# Patient Record
Sex: Male | Born: 1960 | Race: White | Hispanic: No | Marital: Married | Smoking: Current every day smoker
Health system: Southern US, Community
[De-identification: ages and names within clinical notes are randomized; demographics above are authoritative.]

## PROBLEM LIST (undated history)

## (undated) DIAGNOSIS — I10 Essential (primary) hypertension: Secondary | ICD-10-CM

## (undated) HISTORY — PX: TONSILLECTOMY: SHX5217

## (undated) HISTORY — PX: APPENDECTOMY: SHX54

---

## 2019-09-03 HISTORY — PX: COLONOSCOPY: SHX174

## 2020-01-25 ENCOUNTER — Inpatient Hospital Stay (HOSPITAL_COMMUNITY)
Admission: EM | Admit: 2020-01-25 | Discharge: 2020-01-28 | DRG: 964 | Disposition: A | Discharge status: Home / Self Care | Payer: Managed Care, Other (non HMO) | Attending: General Surgery | Admitting: General Surgery

## 2020-01-25 ENCOUNTER — Other Ambulatory Visit: Payer: Self-pay

## 2020-01-25 ENCOUNTER — Emergency Department (HOSPITAL_COMMUNITY): Payer: Managed Care, Other (non HMO)

## 2020-01-25 ENCOUNTER — Inpatient Hospital Stay (HOSPITAL_COMMUNITY): Payer: Managed Care, Other (non HMO)

## 2020-01-25 DIAGNOSIS — S32019A Unspecified fracture of first lumbar vertebra, initial encounter for closed fracture: Secondary | ICD-10-CM | POA: Diagnosis present

## 2020-01-25 DIAGNOSIS — Y92410 Unspecified street and highway as the place of occurrence of the external cause: Secondary | ICD-10-CM

## 2020-01-25 DIAGNOSIS — F1010 Alcohol abuse, uncomplicated: Secondary | ICD-10-CM | POA: Diagnosis present

## 2020-01-25 DIAGNOSIS — I10 Essential (primary) hypertension: Secondary | ICD-10-CM | POA: Diagnosis present

## 2020-01-25 DIAGNOSIS — S270XXA Traumatic pneumothorax, initial encounter: Secondary | ICD-10-CM | POA: Diagnosis present

## 2020-01-25 DIAGNOSIS — S065X9A Traumatic subdural hemorrhage with loss of consciousness of unspecified duration, initial encounter: Secondary | ICD-10-CM

## 2020-01-25 DIAGNOSIS — S32048A Other fracture of fourth lumbar vertebra, initial encounter for closed fracture: Secondary | ICD-10-CM | POA: Diagnosis present

## 2020-01-25 DIAGNOSIS — S065X0A Traumatic subdural hemorrhage without loss of consciousness, initial encounter: Principal | ICD-10-CM | POA: Diagnosis present

## 2020-01-25 DIAGNOSIS — F17210 Nicotine dependence, cigarettes, uncomplicated: Secondary | ICD-10-CM | POA: Diagnosis present

## 2020-01-25 DIAGNOSIS — S065XAA Traumatic subdural hemorrhage with loss of consciousness status unknown, initial encounter: Secondary | ICD-10-CM

## 2020-01-25 DIAGNOSIS — S32029A Unspecified fracture of second lumbar vertebra, initial encounter for closed fracture: Secondary | ICD-10-CM | POA: Diagnosis present

## 2020-01-25 DIAGNOSIS — R519 Headache, unspecified: Secondary | ICD-10-CM | POA: Diagnosis present

## 2020-01-25 DIAGNOSIS — S22059A Unspecified fracture of T5-T6 vertebra, initial encounter for closed fracture: Secondary | ICD-10-CM | POA: Diagnosis present

## 2020-01-25 DIAGNOSIS — Z20822 Contact with and (suspected) exposure to covid-19: Secondary | ICD-10-CM | POA: Diagnosis present

## 2020-01-25 DIAGNOSIS — J939 Pneumothorax, unspecified: Secondary | ICD-10-CM

## 2020-01-25 DIAGNOSIS — S2242XA Multiple fractures of ribs, left side, initial encounter for closed fracture: Secondary | ICD-10-CM | POA: Diagnosis present

## 2020-01-25 DIAGNOSIS — R52 Pain, unspecified: Secondary | ICD-10-CM

## 2020-01-25 DIAGNOSIS — J9690 Respiratory failure, unspecified, unspecified whether with hypoxia or hypercapnia: Secondary | ICD-10-CM

## 2020-01-25 HISTORY — DX: Traumatic subdural hemorrhage with loss of consciousness of unspecified duration, initial encounter: S06.5X9A

## 2020-01-25 HISTORY — DX: Essential (primary) hypertension: I10

## 2020-01-25 HISTORY — DX: Traumatic subdural hemorrhage with loss of consciousness status unknown, initial encounter: S06.5XAA

## 2020-01-25 LAB — I-STAT CHEM 8, ED
BUN: 18 mg/dL (ref 6–20)
Calcium, Ion: 0.94 mmol/L — ABNORMAL LOW (ref 1.15–1.40)
Chloride: 106 mmol/L (ref 98–111)
Creatinine, Ser: 1 mg/dL (ref 0.61–1.24)
Glucose, Bld: 76 mg/dL (ref 70–99)
HCT: 44 % (ref 39.0–52.0)
Hemoglobin: 15 g/dL (ref 13.0–17.0)
Potassium: 4 mmol/L (ref 3.5–5.1)
Sodium: 136 mmol/L (ref 135–145)
TCO2: 22 mmol/L (ref 22–32)

## 2020-01-25 LAB — CBC
HCT: 45.4 % (ref 39.0–52.0)
HCT: 44.5 % (ref 39.0–52.0)
Hemoglobin: 15.1 g/dL (ref 13.0–17.0)
Hemoglobin: 15.2 g/dL (ref 13.0–17.0)
MCH: 33 pg (ref 26.0–34.0)
MCH: 33.2 pg (ref 26.0–34.0)
MCHC: 33.3 g/dL (ref 30.0–36.0)
MCHC: 34.2 g/dL (ref 30.0–36.0)
MCV: 99.3 fL (ref 80.0–100.0)
MCV: 97.2 fL (ref 80.0–100.0)
Platelets: 358 10*3/uL (ref 150–400)
Platelets: 408 10*3/uL — ABNORMAL HIGH (ref 150–400)
RBC: 4.57 MIL/uL (ref 4.22–5.81)
RBC: 4.58 MIL/uL (ref 4.22–5.81)
RDW: 13.4 % (ref 11.5–15.5)
RDW: 13.5 % (ref 11.5–15.5)
WBC: 7.5 10*3/uL (ref 4.0–10.5)
WBC: 8.6 10*3/uL (ref 4.0–10.5)
nRBC: 0 % (ref 0.0–0.2)
nRBC: 0 % (ref 0.0–0.2)

## 2020-01-25 LAB — COMPREHENSIVE METABOLIC PANEL
ALT: 24 U/L (ref 0–44)
ALT: 23 U/L (ref 0–44)
AST: 33 U/L (ref 15–41)
AST: 33 U/L (ref 15–41)
Albumin: 3.4 g/dL — ABNORMAL LOW (ref 3.5–5.0)
Albumin: 3.6 g/dL (ref 3.5–5.0)
Alkaline Phosphatase: 86 U/L (ref 38–126)
Alkaline Phosphatase: 90 U/L (ref 38–126)
Anion gap: 14 (ref 5–15)
Anion gap: 13 (ref 5–15)
BUN: 15 mg/dL (ref 6–20)
BUN: 11 mg/dL (ref 6–20)
CO2: 19 mmol/L — ABNORMAL LOW (ref 22–32)
CO2: 21 mmol/L — ABNORMAL LOW (ref 22–32)
Calcium: 8.1 mg/dL — ABNORMAL LOW (ref 8.9–10.3)
Calcium: 8.4 mg/dL — ABNORMAL LOW (ref 8.9–10.3)
Chloride: 104 mmol/L (ref 98–111)
Chloride: 104 mmol/L (ref 98–111)
Creatinine, Ser: 0.86 mg/dL (ref 0.61–1.24)
Creatinine, Ser: 0.75 mg/dL (ref 0.61–1.24)
GFR calc Af Amer: >60 mL/min (ref >60)
GFR calc Af Amer: >60 mL/min (ref >60)
GFR calc non Af Amer: >60 mL/min (ref >60)
GFR calc non Af Amer: >60 mL/min (ref >60)
Glucose, Bld: 82 mg/dL (ref 70–99)
Glucose, Bld: 88 mg/dL (ref 70–99)
Potassium: 4 mmol/L (ref 3.5–5.1)
Potassium: 4 mmol/L (ref 3.5–5.1)
Sodium: 137 mmol/L (ref 135–145)
Sodium: 138 mmol/L (ref 135–145)
Total Bilirubin: 0.8 mg/dL (ref 0.3–1.2)
Total Bilirubin: 0.7 mg/dL (ref 0.3–1.2)
Total Protein: 6.7 g/dL (ref 6.5–8.1)
Total Protein: 7.1 g/dL (ref 6.5–8.1)

## 2020-01-25 LAB — SAMPLE TO BLOOD BANK

## 2020-01-25 LAB — PHOSPHORUS: Phosphorus: 4 mg/dL (ref 2.5–4.6)

## 2020-01-25 LAB — PROTIME-INR
INR: 1 (ref 0.8–1.2)
Prothrombin Time: 13 seconds (ref 11.4–15.2)

## 2020-01-25 LAB — ETHANOL: Alcohol, Ethyl (B): 213 mg/dL — ABNORMAL HIGH (ref <10)

## 2020-01-25 LAB — MAGNESIUM: Magnesium: 1.8 mg/dL (ref 1.7–2.4)

## 2020-01-25 LAB — LACTIC ACID, PLASMA: Lactic Acid, Venous: 2.1 mmol/L (ref 0.5–1.9)

## 2020-01-25 MED ORDER — MORPHINE SULFATE (PF) 2 MG/ML IV SOLN
2.0000 mg | INTRAVENOUS | Status: DC | PRN
  Administered 2020-01-26 – 2020-01-28 (×8): 2 mg via INTRAVENOUS
  Filled 2020-01-25 (×8): qty 1

## 2020-01-25 MED ORDER — LACTATED RINGERS IV BOLUS
1000.0000 mL | Freq: Once | INTRAVENOUS | Status: AC
  Administered 2020-01-25: 1000 mL via INTRAVENOUS

## 2020-01-25 MED ORDER — ADULT MULTIVITAMIN W/MINERALS CH
1.0000 | ORAL_TABLET | Freq: Every day | ORAL | Status: DC
  Administered 2020-01-26 – 2020-01-28 (×3): 1 via ORAL
  Filled 2020-01-25 (×3): qty 1

## 2020-01-25 MED ORDER — LEVETIRACETAM IN NACL 500 MG/100ML IV SOLN
500.0000 mg | Freq: Two times a day (BID) | INTRAVENOUS | Status: DC
  Administered 2020-01-26 – 2020-01-28 (×5): 500 mg via INTRAVENOUS
  Filled 2020-01-25 (×7): qty 100

## 2020-01-25 MED ORDER — FOLIC ACID 1 MG PO TABS
1.0000 mg | ORAL_TABLET | Freq: Every day | ORAL | Status: DC
  Administered 2020-01-26 – 2020-01-28 (×3): 1 mg via ORAL
  Filled 2020-01-25 (×3): qty 1

## 2020-01-25 MED ORDER — DOCUSATE SODIUM 100 MG PO CAPS
100.0000 mg | ORAL_CAPSULE | Freq: Two times a day (BID) | ORAL | Status: DC
  Administered 2020-01-25 – 2020-01-28 (×6): 100 mg via ORAL
  Filled 2020-01-25 (×6): qty 1

## 2020-01-25 MED ORDER — ONDANSETRON 4 MG PO TBDP: 4.0000 mg | ORAL_TABLET | Freq: Four times a day (QID) | ORAL | Status: DC | PRN

## 2020-01-25 MED ORDER — ONDANSETRON HCL 4 MG/2ML IJ SOLN: 4.0000 mg | Freq: Four times a day (QID) | INTRAMUSCULAR | Status: DC | PRN

## 2020-01-25 MED ORDER — THIAMINE HCL 100 MG/ML IJ SOLN: 100.0000 mg | Freq: Every day | INTRAMUSCULAR | Status: DC

## 2020-01-25 MED ORDER — THIAMINE HCL 100 MG PO TABS
100.0000 mg | ORAL_TABLET | Freq: Every day | ORAL | Status: DC
  Administered 2020-01-26 – 2020-01-28 (×3): 100 mg via ORAL
  Filled 2020-01-25 (×3): qty 1

## 2020-01-25 MED ORDER — LORAZEPAM 2 MG/ML IJ SOLN
1.0000 mg | INTRAMUSCULAR | Status: DC | PRN
  Administered 2020-01-28: 2 mg via INTRAVENOUS
  Filled 2020-01-25 (×2): qty 1

## 2020-01-25 MED ORDER — OXYCODONE HCL 5 MG PO TABS
5.0000 mg | ORAL_TABLET | ORAL | Status: DC | PRN
  Administered 2020-01-25 – 2020-01-26 (×2): 5 mg via ORAL
  Administered 2020-01-26 – 2020-01-27 (×5): 10 mg via ORAL
  Administered 2020-01-27: 5 mg via ORAL
  Administered 2020-01-27 – 2020-01-28 (×5): 10 mg via ORAL
  Filled 2020-01-25: qty 2
  Filled 2020-01-25: qty 1
  Filled 2020-01-25 (×4): qty 2
  Filled 2020-01-25: qty 1
  Filled 2020-01-25 (×2): qty 2
  Filled 2020-01-25: qty 1
  Filled 2020-01-25 (×3): qty 2

## 2020-01-25 MED ORDER — SODIUM CHLORIDE 0.9 % IV SOLN: INTRAVENOUS | Status: DC

## 2020-01-25 MED ORDER — IOHEXOL 300 MG/ML  SOLN
100.0000 mL | Freq: Once | INTRAMUSCULAR | Status: AC | PRN
  Administered 2020-01-25: 100 mL via INTRAVENOUS

## 2020-01-25 MED ORDER — ACETAMINOPHEN 500 MG PO TABS
1000.0000 mg | ORAL_TABLET | Freq: Four times a day (QID) | ORAL | Status: DC
  Administered 2020-01-26 – 2020-01-28 (×6): 1000 mg via ORAL
  Filled 2020-01-25 (×9): qty 2

## 2020-01-25 MED ORDER — MORPHINE SULFATE (PF) 4 MG/ML IV SOLN
4.0000 mg | Freq: Once | INTRAVENOUS | Status: AC
  Administered 2020-01-25: 4 mg via INTRAVENOUS
  Filled 2020-01-25: qty 1

## 2020-01-25 MED ORDER — LEVETIRACETAM IN NACL 1000 MG/100ML IV SOLN
1000.0000 mg | Freq: Once | INTRAVENOUS | Status: AC
  Administered 2020-01-25: 1000 mg via INTRAVENOUS
  Filled 2020-01-25: qty 100

## 2020-01-25 MED ORDER — LORAZEPAM 1 MG PO TABS
1.0000 mg | ORAL_TABLET | ORAL | Status: DC | PRN
  Administered 2020-01-27 (×2): 1 mg via ORAL
  Filled 2020-01-25 (×2): qty 1
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
## 2020-01-25 NOTE — Progress Notes (Signed)
Orthopedic Tech Progress Note Patient Details:  John Olson 12/31/1960 [id number]  Patient ID: John Olson, male   DOB: [birth year], 59 y.o.   MRN: [id number]   Saul Fordyce 01/25/2020, 6:42 PMlevel 2 Trauma alert

## 2020-01-26 ENCOUNTER — Inpatient Hospital Stay (HOSPITAL_COMMUNITY): Payer: Managed Care, Other (non HMO)

## 2020-01-26 ENCOUNTER — Encounter (HOSPITAL_COMMUNITY): Payer: Self-pay

## 2020-01-26 LAB — SARS CORONAVIRUS 2 (TAT 6-24 HRS): SARS Coronavirus 2: NEGATIVE

## 2020-01-26 LAB — BASIC METABOLIC PANEL
Anion gap: 14 (ref 5–15)
BUN: 8 mg/dL (ref 6–20)
CO2: 23 mmol/L (ref 22–32)
Calcium: 9 mg/dL (ref 8.9–10.3)
Chloride: 101 mmol/L (ref 98–111)
Creatinine, Ser: 0.6 mg/dL — ABNORMAL LOW (ref 0.61–1.24)
GFR calc Af Amer: >60 mL/min (ref >60)
GFR calc non Af Amer: >60 mL/min (ref >60)
Glucose, Bld: 94 mg/dL (ref 70–99)
Potassium: 4 mmol/L (ref 3.5–5.1)
Sodium: 138 mmol/L (ref 135–145)

## 2020-01-26 LAB — URINALYSIS, ROUTINE W REFLEX MICROSCOPIC
Bilirubin Urine: NEGATIVE
Glucose, UA: NEGATIVE mg/dL
Hgb urine dipstick: NEGATIVE
Ketones, ur: NEGATIVE mg/dL
Leukocytes,Ua: NEGATIVE
Nitrite: NEGATIVE
Protein, ur: NEGATIVE mg/dL
Specific Gravity, Urine: 1.015 (ref 1.005–1.030)
pH: 7 (ref 5.0–8.0)

## 2020-01-26 LAB — CBC
HCT: 44.7 % (ref 39.0–52.0)
Hemoglobin: 15.7 g/dL (ref 13.0–17.0)
MCH: 33.8 pg (ref 26.0–34.0)
MCHC: 35.1 g/dL (ref 30.0–36.0)
MCV: 96.1 fL (ref 80.0–100.0)
Platelets: 407 10*3/uL — ABNORMAL HIGH (ref 150–400)
RBC: 4.65 MIL/uL (ref 4.22–5.81)
RDW: 13.8 % (ref 11.5–15.5)
WBC: 8.5 10*3/uL (ref 4.0–10.5)
nRBC: 0 % (ref 0.0–0.2)

## 2020-01-26 LAB — MRSA PCR SCREENING: MRSA by PCR: NEGATIVE

## 2020-01-26 LAB — HIV ANTIBODY (ROUTINE TESTING W REFLEX): HIV Screen 4th Generation wRfx: NONREACTIVE

## 2020-01-26 MED ORDER — HYDRALAZINE HCL 20 MG/ML IJ SOLN
10.0000 mg | Freq: Four times a day (QID) | INTRAMUSCULAR | Status: DC | PRN
  Administered 2020-01-26 – 2020-01-27 (×4): 10 mg via INTRAVENOUS
  Filled 2020-01-26 (×4): qty 1

## 2020-01-26 MED ORDER — CHLORHEXIDINE GLUCONATE CLOTH 2 % EX PADS
6.0000 | MEDICATED_PAD | Freq: Every day | CUTANEOUS | Status: DC
  Administered 2020-01-26 – 2020-01-27 (×2): 6 via TOPICAL

## 2020-01-26 MED ORDER — METOPROLOL TARTRATE 5 MG/5ML IV SOLN
5.0000 mg | Freq: Four times a day (QID) | INTRAVENOUS | Status: DC | PRN
  Administered 2020-01-26: 5 mg via INTRAVENOUS
  Filled 2020-01-26: qty 5

## 2020-01-26 MED ORDER — AMLODIPINE BESYLATE 10 MG PO TABS
10.0000 mg | ORAL_TABLET | Freq: Every day | ORAL | Status: DC
  Administered 2020-01-26 – 2020-01-28 (×3): 10 mg via ORAL
  Filled 2020-01-26 (×3): qty 1

## 2020-01-26 MED ORDER — LISINOPRIL 10 MG PO TABS
10.0000 mg | ORAL_TABLET | Freq: Every day | ORAL | Status: DC
  Administered 2020-01-26 – 2020-01-28 (×3): 10 mg via ORAL
  Filled 2020-01-26 (×4): qty 1

## 2020-01-26 MED ORDER — METOPROLOL TARTRATE 5 MG/5ML IV SOLN
5.0000 mg | Freq: Once | INTRAVENOUS | Status: AC
  Administered 2020-01-26: 5 mg via INTRAVENOUS
  Filled 2020-01-26: qty 5

## 2020-01-26 MED ORDER — LISINOPRIL 10 MG PO TABS: 10.0000 mg | ORAL_TABLET | Freq: Every day | ORAL | Status: DC

## 2020-01-26 NOTE — TOC Progression Note (Signed)
Transition of Care [facility]) - Progression Note    Patient Details  Name: John Olson MRN: [id number] Date of Birth: 28-Jul-1961  Transition of Care [facility]) CM/SW [place], LCSW Phone Number: 01/26/2020, 3:09 PM  Clinical Narrative: CSW met with patient to engage in SBIRT per trauma protocol. Patient scored a total of 8 on their SBIRT exam. CSW provided psychoeducation on substance use and offered patient with resources as appropriate. CSW provided brief intervention to patient discussing alcohol treatment resources. Patient accepted outpatient resources. Patient responded guarded to discussions of substance use.         Expected Discharge Plan and Services                                                 Social Determinants of Health (SDOH) Interventions    Readmission Risk Interventions No flowsheet data found.
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
## 2020-01-27 ENCOUNTER — Inpatient Hospital Stay (HOSPITAL_COMMUNITY): Payer: Managed Care, Other (non HMO)

## 2020-01-27 DIAGNOSIS — J939 Pneumothorax, unspecified: Secondary | ICD-10-CM | POA: Diagnosis present

## 2020-01-27 HISTORY — DX: Pneumothorax, unspecified: J93.9

## 2020-01-27 MED ORDER — SPIRITUS FRUMENTI
1.0000 | Freq: Two times a day (BID) | ORAL | Status: DC
  Administered 2020-01-27 – 2020-01-28 (×2): 1 via ORAL
  Filled 2020-01-27 (×3): qty 1
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
## 2020-01-27 NOTE — Progress Notes (Signed)
1413- Dr. Bedelia Person returned page. MD informed of patient agitation, pt self removed IV, threatening to leave AMA, and refusing the following: bed alarm or assistance with ambulation, supplemental oxygen, continuous pulse ox, SCD's. Patient reports difficulty breathing, O2 sat 97% on room air, crackle lung sounds audible, Ativan 1mg  administered. MD will place new orders.
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
## 2020-01-27 NOTE — Progress Notes (Signed)
Dr. Bedelia Person e-paged regarding patient. Patient refusing bed alarm, self removed IV, agitated, threatening to leave, still refusing continuous pulse ox and supplemental oxygen. Oxygen 97% on room air. Ativan administered.

## 2020-01-27 NOTE — Progress Notes (Addendum)
Pt refuses to wear oxygen, continuous pulse ox, and SCD's at this time. Patient educated.

## 2020-01-27 NOTE — Progress Notes (Signed)
PT Cancellation Note  Patient Details Name: John Olson MRN: [id number] DOB: [birth year]   Cancelled Treatment:    Reason Eval/Treat Not Completed: Other (comment)(RN stated patient aggitation too high to work with PT) Spoke to RN who stated that patient was too agitated to work with PT currently. States he has refused bed/chair alarms and is moving around independently. Attempting to remove IV,etc. Will continue checking on patient as time allows.  Oda Cogan PT, DPT Acute Rehab [facility] Rehabilitation P: [phone]   [name] 01/27/2020, 2:28 PM

## 2020-01-27 NOTE — Plan of Care (Signed)
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
## 2020-01-28 ENCOUNTER — Inpatient Hospital Stay (HOSPITAL_COMMUNITY): Payer: Managed Care, Other (non HMO)

## 2020-01-28 MED ORDER — ACETAMINOPHEN 500 MG PO TABS: 1000.0000 mg | ORAL_TABLET | Freq: Four times a day (QID) | ORAL | 0 refills | Status: AC | PRN

## 2020-01-28 MED ORDER — OXYCODONE HCL 5 MG PO TABS: 5.0000 mg | ORAL_TABLET | Freq: Four times a day (QID) | ORAL | 0 refills | Status: DC | PRN

## 2020-01-28 MED ORDER — LEVETIRACETAM 500 MG PO TABS: 500.0000 mg | ORAL_TABLET | Freq: Two times a day (BID) | ORAL | 0 refills | Status: AC

## 2020-01-28 NOTE — Progress Notes (Signed)
RN reviewed discharge paperwork and attached education with patient. Patient verbalized understanding. Paperwork given to patient. Patient gathered belongings. Wife transporting patient home.

## 2020-01-28 NOTE — Progress Notes (Signed)
PT Cancellation Note  Patient Details Name: John Olson MRN: [id number] DOB: 02/21/1961   Cancelled Treatment:    Reason Eval/Treat Not Completed: Patient declined, no reason specified. Upon arrival for PT tx, pt resting in bed with lights off. Pt reporting feeling better after getting agitated overnight but refusing therapy at this time and requesting to attempt therapy later. PT will follow up at later date/time as able and pt appropriate.   Karoline Caldwell PT, DPT 9:25 AM,01/28/20    Athelene Hursey Shyrl Numbers 01/28/2020, 9:24 AM

## 2020-01-28 NOTE — Progress Notes (Signed)
Pt's bed alarm went off,and when we came to checked, found pt in the bathroom very agitated. Pulled out IV and oxygen. Said that he dropped urinal and can't find it . Pt in a lot of pain 10/10 and assisted back to bed. Will place new IV and give pain med. And prn ativan. Requested Rapid response Rn to further assess pt.

## 2020-01-28 NOTE — Discharge Instructions (Signed)
Transverse Process Fracture  Bones of the spine (vertebrae) have portions that extend off to either side of the spine. These portions of bone are called transverse processes. A transverse process fracture, which is also called a rotation spine fracture, is a break in a transverse process. What are the causes? This condition may be caused by:  A fall from a great height.  A car accident.  A sports injury.  A gunshot wound.  A hard, direct hit to the back. This kind of fracture often results from a sudden and severe bending of the spine to one side. Depending on the cause of the fracture, one or more bones may be affected. What increases the risk? You are more likely to develop this condition if:  You have thinning and loss of density in the bones (osteoporosis).  You play a contact sport. What are the signs or symptoms? The main symptom of this condition is back pain. The pain may:  Be felt on the side of the spine (flank) where the fracture is.  Get worse when you move or take a deep breath. How is this diagnosed? This condition may be diagnosed based on:  Your symptoms.  Your medical history.  A physical exam. You may also have other tests, including:  X-rays.  A CT scan.  MRI. How is this treated? Most transverse process fractures heal on their own with time and rest. Treatment may involve supportive care, such as:  Limiting activity.  Medicines, such as: ? Pain medicine. ? Muscle-relaxing medicine.  Physical therapy.  A neck or back brace. Follow these instructions at home: If you have a brace:  Wear the neck or back brace as told by your health care provider. Remove it only as told by your health care provider.  Keep the brace clean.  If the brace is not waterproof: ? Do not let it get wet. ? Cover it with a watertight covering when you take a bath or a shower. Managing pain, stiffness, and swelling   If directed, put ice on the injured  area: ? If you have a removable brace, remove it as told by your health care provider. ? Put ice in a plastic bag. ? Place a towel between your skin and the bag. ? Leave the ice on for 20 minutes, 2-3 times a day. Medicines  Take over-the-counter and prescription medicines only as told by your health care provider.  Do not drive or use heavy machinery while taking prescription pain medicine.  If you are taking prescription pain medicine, take actions to prevent or treat constipation. Your health care provider may recommend that you: ? Drink enough fluid to keep your urine pale yellow. ? Eat foods that are high in fiber, such as fresh fruits and vegetables, whole grains, and beans. ? Limit foods that are high in fat and processed sugars, such as fried or sweet foods. ? Take an over-the-counter or prescription medicine for constipation. Activity  Stay in bed (on bed rest) only as directed by your health care provider. ? Avoid being in bed for a long time without moving. Get up to take short walks every 1-2 hours. This is important to improve blood flow and breathing. Ask for help if you feel weak or unsteady.  Return to your normal activities when your health care provider says it is okay. Ask if there are any activities that you should not do.  Do physical therapy exercises as recommended by your health care provider. General instructions  Do not use any products that contain nicotine or tobacco, such as cigarettes and e-cigarettes. These can delay bone healing. If you need help quitting, ask your health care provider.  Keep all follow-up visits as told by your health care provider. This is important. Visits can help to prevent permanent injury, disability, and long-lasting (chronic) pain. Contact a health care provider if:  You have a fever.  You develop a cough that makes your pain worse.  Your pain medicine is not helping.  Your pain does not get better over time.  You cannot  return to your normal activities as planned or expected. Get help right away if:  Your pain is very bad and it suddenly gets worse.  You are unable to move any body part (paralysis) that is below the level of your injury.  You have numbness, tingling, or weakness in any body part that is below the level of your injury.  You cannot control your bladder or bowels. Summary  A transverse process fracture is a break in the portion of the bone that extends to the side of the spine.  Most transverse process fractures heal on their own with time and rest.  You may also have supportive treatments such as a back brace, pain medicines, and physical therapy.  Keep all follow-up visits. This is important and will help to prevent permanent injury, disability, and long-lasting (chronic) pain. This information is not intended to replace advice given to you by your health care provider. Make sure you discuss any questions you have with your health care provider. Document Revised: 12/07/2017 Document Reviewed: 12/07/2017 Elsevier Patient Education  2020 Elsevier Inc.   Subdural Hematoma  A subdural hematoma is a collection of blood between the brain and its outer covering (dura). As the amount of blood increases, pressure builds on the brain. There are two types of subdural hematomas:  Acute. This type develops shortly after a hard, direct hit to the head and causes blood to collect very quickly. This is a medical emergency. If it is not diagnosed and treated quickly, it can lead to severe brain injury or death.  Chronic. This is when bleeding develops more slowly, over weeks or months. In some cases, this type does not cause symptoms. What are the causes? This condition is caused by bleeding (hemorrhage) from a broken (ruptured) blood vessel. In most cases, a blood vessel ruptures and bleeds because of a head injury, such as from a hard, direct hit. Head injuries can happen in car accidents, falls,  assaults, or while playing sports. In rare cases, a hemorrhage can happen without a known cause (spontaneously), especially if you take blood thinners (anticoagulants). What increases the risk? This condition is more likely to develop in:  Older people.  Infants.  People who take blood thinners.  People who have head injuries.  People who abuse alcohol. What are the signs or symptoms? Symptoms of this condition can vary depending on the size of the hematoma. Symptoms can be mild, severe, or life-threatening. They include:  Headaches.  Nausea or vomiting.  Changes in vision, such as double vision or loss of vision.  Changes in speech or trouble understanding what people say.  Loss of balance or trouble walking.  Weakness, numbness, or tingling in the arms or legs, especially on one side of the body.  Seizures.  Change in personality.  Increased sleepiness.  Memory loss.  Loss of consciousness.  Coma. Symptoms of acute subdural hematoma can develop over minutes or  hours. Symptoms of chronic subdural hematoma may develop over weeks or months. How is this diagnosed? This condition is diagnosed based on the results of:  A physical exam.  Tests of strength, reflexes, coordination, senses, manner of walking (gait), and facial and eye movements (neurological exam).  Imaging tests, such as an MRI or a CT scan. How is this treated? Treatment for this condition depends on the type of hematoma and how severe it is. Treatment for acute hematoma may include:  Emergency surgery to drain blood or remove a blood clot.  Medicines that help the body get rid of excess fluids (diuretics). These may help to reduce pressure in the brain.  Assisted breathing (ventilation). Treatment for chronic hematoma may include:  Observation and bed rest at the hospital.  Surgery. If you take blood thinners, you may need to stop taking them for a short time. You may also be given  anti-seizure (anticonvulsant) medicine. Sometimes, no treatment is needed for chronic subdural hematoma. Follow these instructions at home: Activity  Avoid situations where you could injure your head again, such as in competitive sports, downhill snow sports, and horseback riding. Do not do these activities until your health care provider approves. ? Wear protective gear, such as a helmet, when participating in activities such as biking or contact sports.  Avoid too much visual stimulation while recovering. This means limiting how much you read and limiting your screen time on a smart phone, tablet, computer, or TV.  Rest as told by your health care provider. Rest helps the brain heal.  Try to avoid activities that cause physical or mental stress. Return to work or school as told by your health care provider.  Do not lift anything that is heavier than 5 lb (2.3 kg), or the limit you are told, until your health care provider says that it is safe.  Do not drive, ride a bike, or use heavy machinery until your health care provider approves.  Always wear your seat belt when you are in a motor vehicle. Alcohol use  Do not drink alcohol if your health care provider tells you not to drink.  If you drink alcohol, limit how much you use to: ? 0-1 drink a day for women. ? 0-2 drinks a day for men. General instructions  Monitor your symptoms, and ask people around you to do the same. Recovery from brain injuries varies. Talk with your health care provider about what to expect.  Take over-the-counter and prescription medicines only as told by your health care provider. Do not take blood thinners or NSAIDs unless your health care provider approves. These include aspirin, ibuprofen, naproxen, and warfarin.  Keep your home environment safe to reduce the risk of falling.  Keep all follow-up visits as told by your health care provider. This is important. Where to find more information  [employer] of Neurological Disorders and Stroke: [employer].co.uk  American Academy of Neurology (AAN): ComparePet.cz  Brain Injury Association of America: www.biausa.org Get help right away if you:  Are taking blood thinners and you fall or you experience minor trauma to the head. If you take any blood thinners, even a very small injury can cause a subdural hematoma.  Have a bleeding disorder and you fall or you experience minor trauma to the head.  Develop any of the following symptoms after a head injury: ? Clear fluid draining from your nose or ears. ? Nausea or vomiting. ? Changes in speech or trouble understanding what people say. ?  Seizures. ? Drowsiness or a decrease in alertness. ? Double vision. ? Numbness or inability to move (paralysis) in any part of your body. ? Difficulty walking or poor coordination. ? Difficulty thinking. ? Confusion or forgetfulness. ? Personality changes. ? Irrational or aggressive behavior. These symptoms may represent a serious problem that is an emergency. Do not wait to see if the symptoms will go away. Get medical help right away. Call your local emergency services (911 in the [place] Do not drive yourself to the hospital. Summary  A subdural hematoma is a collection of blood between the brain and its outer covering (dura).  Treatment for this condition depends on what type of subdural hematoma you have and how severe it is.  Symptoms can vary from mild to severe to life-threatening.  Monitor your symptoms, and ask others around you to do the same. This information is not intended to replace advice given to you by your health care provider. Make sure you discuss any questions you have with your health care provider. Document Revised: 09/25/2018 Document Reviewed: 09/25/2018 Elsevier Patient Education  2020 Elsevier Inc.  Rib Fracture  A rib fracture is a break or crack in one of the bones of the ribs. The ribs are like a cage that goes  around your upper chest. A broken or cracked rib is often painful, but most do not cause other problems. Most rib fractures usually heal on their own in 1-3 months. Follow these instructions at home: Managing pain, stiffness, and swelling  If directed, apply ice to the injured area. ? Put ice in a plastic bag. ? Place a towel between your skin and the bag. ? Leave the ice on for 20 minutes, 2-3 times a day.  Take over-the-counter and prescription medicines only as told by your doctor. Activity  Avoid activities that cause pain to the injured area. Protect your injured area.  Slowly increase activity as told by your doctor. General instructions  Do deep breathing as told by your doctor. You may be told to: ? Take deep breaths many times a day. ? Cough many times a day while hugging a pillow. ? Use a device (incentive spirometer) to do deep breathing many times a day.  Drink enough fluid to keep your pee (urine) clear or pale yellow.  Do not wear a rib belt or binder. These do not allow you to breathe deeply.  Keep all follow-up visits as told by your doctor. This is important. Contact a doctor if:  You have a fever. Get help right away if:  You have trouble breathing.  You are short of breath.  You cannot stop coughing.  You cough up thick or bloody spit (sputum).  You feel sick to your stomach (nauseous), throw up (vomit), or have belly (abdominal) pain.  Your pain gets worse and medicine does not help. Summary  A rib fracture is a break or crack in one of the bones of the ribs.  Apply ice to the injured area and take medicines for pain as told by your doctor.  Take deep breaths and cough many times a day. Hug a pillow every time you cough. This information is not intended to replace advice given to you by your health care provider. Make sure you discuss any questions you have with your health care provider. Document Revised: 10/07/2017 Document Reviewed:  01/25/2017 Elsevier Patient Education  2020 Elsevier Inc.   Pneumothorax A pneumothorax is commonly called a collapsed lung. It is a condition in  which air leaks from a lung and builds up between the thin layer of tissue that covers the lungs (visceral pleura) and the interior wall of the chest cavity (parietal pleura). The air gets trapped outside the lung, between the lung and the chest wall (pleural space). The air takes up space and prevents the lung from fully expanding. This condition sometimes occurs suddenly with no apparent cause. The buildup of air may be small or large. A small pneumothorax may go away on its own. A large pneumothorax will require treatment and hospitalization. What are the causes? This condition may be caused by:  Trauma and injury to the chest wall.  Surgery and other medical procedures.  A complication of an underlying lung problem, especially chronic obstructive pulmonary disease (COPD) or emphysema. Sometimes the cause of this condition is not known. What increases the risk? You are more likely to develop this condition if:  You have an underlying lung problem.  You smoke.  You are 63-40 years old, male, tall, and underweight.  You have a personal or family history of pneumothorax.  You have an eating disorder (anorexia nervosa). This condition can also happen quickly, even in people with no history of lung problems. What are the signs or symptoms? Sometimes a pneumothorax will have no symptoms. When symptoms are present, they can include:  Chest pain.  Shortness of breath.  Increased rate of breathing.  Bluish color to your lips or skin (cyanosis). How is this diagnosed? This condition may be diagnosed by:  A medical history and physical exam.  A chest X-ray, chest CT scan, or ultrasound. How is this treated? Treatment depends on how severe your condition is. The goal of treatment is to remove the extra air and allow your lung to expand  back to its normal size.  For a small pneumothorax: ? No treatment may be needed. ? Extra oxygen is sometimes used to make it go away more quickly.  For a large pneumothorax or a pneumothorax that is causing symptoms, a procedure is done to drain the air from your lungs. To do this, a health care provider may use: ? A needle with a syringe. This is used to suck air from a pleural space where no additional leakage is taking place. ? A chest tube. This is used to suck air where there is ongoing leakage into the pleural space. The chest tube may need to remain in place for several days until the air leak has healed.  In more severe cases, surgery may be needed to repair the damage that is causing the leak.  If you have multiple pneumothorax episodes or have an air leak that will not heal, a procedure called a pleurodesis may be done. A medicine is placed in the pleural space to irritate the tissues around the lung so that the lung will stick to the chest wall, seal any leaks, and stop any buildup of air in that space. If you have an underlying lung problem, severe symptoms, or a large pneumothorax you will usually need to stay in the hospital. Follow these instructions at home: Lifestyle  Do not use any products that contain nicotine or tobacco, such as cigarettes and e-cigarettes. These are major risk factors in pneumothorax. If you need help quitting, ask your health care provider.  Do not lift anything that is heavier than 10 lb (4.5 kg), or the limit that your health care provider tells you, until he or she says that it is safe.  Avoid  activities that take a lot of effort (strenuous) for as long as told by your health care provider.  Return to your normal activities as told by your health care provider. Ask your health care provider what activities are safe for you.  Do not fly in an airplane or scuba dive until your health care provider says it is okay. General instructions  Take  over-the-counter and prescription medicines only as told by your health care provider.  If a cough or pain makes it difficult for you to sleep at night, try sleeping in a semi-upright position in a recliner or by using 2 or 3 pillows.  If you had a chest tube and it was removed, ask your health care provider when you can remove the bandage (dressing). While the dressing is in place, do not allow it to get wet.  Keep all follow-up visits as told by your health care provider. This is important. Contact a health care provider if:  You cough up thick mucus (sputum) that is yellow or green in color.  You were treated with a chest tube, and you have redness, increasing pain, or discharge at the site where it was placed. Get help right away if:  You have increasing chest pain or shortness of breath.  You have a cough that will not go away.  You begin coughing up blood.  You have pain that is getting worse or is not controlled with medicines.  The site where your chest tube was located opens up.  You feel air coming out of the site where the chest tube was placed.  You have a fever or persistent symptoms for more than 2-3 days.  You have a fever and your symptoms suddenly get worse. These symptoms may represent a serious problem that is an emergency. Do not wait to see if the symptoms will go away. Get medical help right away. Call your local emergency services (911 in the [place] Do not drive yourself to the hospital. Summary  A pneumothorax, commonly called a collapsed lung, is a condition in which air leaks from a lung and gets trapped between the lung and the chest wall (pleural space).  The buildup of air may be small or large. A small pneumothorax may go away on its own. A large pneumothorax will require treatment and hospitalization.  Treatment for this condition depends on how severe the pneumothorax is. The goal of treatment is to remove the extra air and allow the lung to expand  back to its normal size. This information is not intended to replace advice given to you by your health care provider. Make sure you discuss any questions you have with your health care provider. Document Revised: 10/07/2017 Document Reviewed: 10/03/2017 Elsevier Patient Education  2020 [employer].
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
## 2020-02-01 ENCOUNTER — Telehealth: Payer: Self-pay

## 2020-02-01 NOTE — Telephone Encounter (Signed)
Patient called and states needs refill for meds and has a nose bleed - I called and left voicemail and advised not out patient - refused referral and would need to contact PCP for med refill

## 2020-07-31 DIAGNOSIS — I10 Essential (primary) hypertension: Secondary | ICD-10-CM | POA: Insufficient documentation

## 2020-08-01 ENCOUNTER — Ambulatory Visit (INDEPENDENT_AMBULATORY_CARE_PROVIDER_SITE_OTHER): Payer: Managed Care, Other (non HMO) | Admitting: Cardiology

## 2020-08-01 ENCOUNTER — Encounter: Payer: Self-pay | Admitting: Cardiology

## 2020-08-01 ENCOUNTER — Other Ambulatory Visit: Payer: Self-pay

## 2020-08-01 VITALS — BP 180/113 | HR 104 | Ht 73.0 in | Wt 209.4 lb

## 2020-08-01 DIAGNOSIS — I10 Essential (primary) hypertension: Secondary | ICD-10-CM

## 2020-08-01 DIAGNOSIS — F329 Major depressive disorder, single episode, unspecified: Secondary | ICD-10-CM

## 2020-08-01 DIAGNOSIS — R0602 Shortness of breath: Secondary | ICD-10-CM

## 2020-08-01 DIAGNOSIS — F32A Depression, unspecified: Secondary | ICD-10-CM

## 2020-08-01 HISTORY — DX: Depression, unspecified: F32.A

## 2020-08-01 HISTORY — DX: Shortness of breath: R06.02

## 2020-08-01 HISTORY — DX: Essential (primary) hypertension: I10

## 2020-08-01 MED ORDER — CARVEDILOL 6.25 MG PO TABS: 6.2500 mg | ORAL_TABLET | Freq: Two times a day (BID) | ORAL | 3 refills | Status: DC

## 2020-08-01 NOTE — Patient Instructions (Addendum)
Medication Instructions:  Start (Carvedilol) Coreg 6.25 twice a day   *If you need a refill on your cardiac medications before your next appointment, please call your pharmacy*   Lab Work: None ordered   If you have labs (blood work) drawn today and your tests are completely normal, you will receive your results only by: Marland Kitchen MyChart Message (if you have MyChart) OR . A paper copy in the mail If you have any lab test that is abnormal or we need to change your treatment, we will call you to review the results.   Testing/Procedures: Your physician has requested that you have an echocardiogram. Echocardiography is a painless test that uses sound waves to create images of your heart. It provides your doctor with information about the size and shape of your heart and how well your heart's chambers and valves are working. This procedure takes approximately one hour. There are no restrictions for this procedure.     Follow-Up: At [facility], you and your health needs are our priority.  As part of our continuing mission to provide you with exceptional heart care, we have created designated Provider Care Teams.  These Care Teams include your primary Cardiologist (physician) and Advanced Practice Providers (APPs -  Physician Assistants and Nurse Practitioners) who all work together to provide you with the care you need, when you need it.  We recommend signing up for the patient portal called "MyChart".  Sign up information is provided on this After Visit Summary.  MyChart is used to connect with patients for Virtual Visits (Telemedicine).  Patients are able to view lab/test results, encounter notes, upcoming appointments, etc.  Non-urgent messages can be sent to your provider as well.   To learn more about what you can do with MyChart, go to [email or web address].    Your next appointment:   1 month(s)  The format for your next appointment:   In Person  Provider:   Thomasene Ripple,  DO   Other Instructions You have been referred to see Psychiatry

## 2020-08-01 NOTE — Progress Notes (Signed)
Cardiology Office Note:    Date:  08/01/2020   ID:  John Olson, DOB 07-17-1961, MRN [id number]  PCP:  Buckner Malta, MD  Cardiologist:  Thomasene Ripple, DO  Electrophysiologist:  None   Referring MD: Buckner Malta, MD   " I am having shortness of breath"  History of Present Illness:    John Olson is a 59 y.o. male with a hx of hypertension on lisinipril 20 mg today presents to be evaluated for shortness of breath.  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  He notes that this is the only time he feels this.  He reports to me his major problem is the fact that he is having trouble with his marriage.  He notes that he is very sad and confused as he has been having extramarital affair and this is really bothering him a lot.  He reports that he is depressed from this and he is not planning any type of suicidal ideation or homicidal ideation.  He denies any chest pain.  Past Medical History:  Diagnosis Date  . High blood pressure     Past Surgical History:  Procedure Laterality Date  . APPENDECTOMY    . COLONOSCOPY  09/03/2019  . TONSILLECTOMY      Current Medications: Current Meds  Medication Sig  . acetaminophen (TYLENOL) 500 MG tablet Take 2 tablets (1,000 mg total) by mouth every 6 (six) hours as needed (for left knee pain).  . Buprenorphine HCl-Naloxone HCl 8-2 MG FILM Place under the tongue.  Marland Kitchen lisinopril (ZESTRIL) 20 MG tablet Take 20 mg by mouth daily.  . sildenafil (REVATIO) 20 MG tablet Take 20 mg by mouth See admin instructions. Take 20 mg by mouth 30 minutes prior to intercourse- do not take at the same time as Lisinopril  . [DISCONTINUED] lisinopril (ZESTRIL) 10 MG tablet Take 10 mg by mouth daily.     Allergies:   Patient has no known allergies.   Social History   Socioeconomic History  . Marital status: Married    Spouse name: Not on file  . Number of  children: Not on file  . Years of education: Not on file  . Highest education level: Not on file  Occupational History  . Not on file  Tobacco Use  . Smoking status: Current Every Day Smoker    Packs/day: 2.00    Types: Cigarettes  . Smokeless tobacco: Never Used  Substance and Sexual Activity  . Alcohol use: Yes    Alcohol/week: 42.0 standard drinks    Types: 42 Cans of beer per week  . Drug use: Not Currently    Types: Heroin  . Sexual activity: Not on file  Other Topics Concern  . Not on file  Social History Narrative  . Not on file   Social Determinants of Health   Financial Resource Strain:   . Difficulty of Paying Living Expenses: Not on file  Food Insecurity:   . Worried About Programme researcher, broadcasting/film/video in the Last Year: Not on file  . Ran Out of Food in the Last Year: Not on file  Transportation Needs:   . Lack of Transportation (Medical): Not on file  . Lack of Transportation (Non-Medical): Not on file  Physical Activity:   . Days of Exercise per Week: Not on file  . Minutes of Exercise per Session: Not on file  Stress:   . Feeling of  Stress : Not on file  Social Connections:   . Frequency of Communication with Friends and Family: Not on file  . Frequency of Social Gatherings with Friends and Family: Not on file  . Attends Religious Services: Not on file  . Active Member of Clubs or Organizations: Not on file  . Attends Banker Meetings: Not on file  . Marital Status: Not on file     Family History: The patient's family history includes Aneurysm in his brother; Cancer in his father; Diabetes in an other family member; Heart attack in an other family member; Hypertension in an other family member; Stroke in an other family member.  ROS:   Review of Systems  Constitution: Negative for decreased appetite, fever and weight gain.  HENT: Negative for congestion, ear discharge, hoarse voice and sore throat.   Eyes: Negative for discharge, redness, vision  loss in right eye and visual halos.  Cardiovascular: Reports dyspnea on exertion.  Denies chest pain, leg swelling, orthopnea and palpitations.  Respiratory: Negative for cough, hemoptysis, shortness of breath and snoring.   Endocrine: Negative for heat intolerance and polyphagia.  Hematologic/Lymphatic: Negative for bleeding problem. Does not bruise/bleed easily.  Skin: Negative for flushing, nail changes, rash and suspicious lesions.  Musculoskeletal: Negative for arthritis, joint pain, muscle cramps, myalgias, neck pain and stiffness.  Gastrointestinal: Negative for abdominal pain, bowel incontinence, diarrhea and excessive appetite.  Genitourinary: Negative for decreased libido, genital sores and incomplete emptying.  Neurological: Negative for brief paralysis, focal weakness, headaches and loss of balance.  Psychiatric/Behavioral: Negative for altered mental status, depression and suicidal ideas.  Allergic/Immunologic: Negative for HIV exposure and persistent infections.    EKGs/Labs/Other Studies Reviewed:    The following studies were reviewed today:   EKG:  The ekg ordered today demonstrates sinus tachycardia, heart rate 104 bpm.  No prior EKG for comparison  Recent Labs: 01/25/2020: ALT 23; Magnesium 1.8 01/26/2020: BUN 8; Creatinine, Ser 0.60; Hemoglobin 15.7; Platelets 407; Potassium 4.0; Sodium 138  Recent Lipid Panel No results found for: CHOL, TRIG, HDL, CHOLHDL, VLDL, LDLCALC, LDLDIRECT  Physical Exam:    VS:  BP (!) 180/113 (BP Location: Left Arm)   Pulse (!) 104   Ht 6\' 1"  (1.854 m)   Wt 209 lb 6.4 oz (95 kg)   SpO2 97%   BMI 27.63 kg/m     Wt Readings from Last 3 Encounters:  08/01/20 209 lb 6.4 oz (95 kg)  01/25/20 222 lb 0.1 oz (100.7 kg)     GEN: Well nourished, well developed in no acute distress HEENT: Normal NECK: No JVD; No carotid bruits LYMPHATICS: No lymphadenopathy CARDIAC: S1S2 noted,RRR, no murmurs, rubs, gallops RESPIRATORY:  Clear to  auscultation without rales, wheezing or rhonchi  ABDOMEN: Soft, non-tender, non-distended, +bowel sounds, no guarding. EXTREMITIES: No edema, No cyanosis, no clubbing MUSCULOSKELETAL:  No deformity  SKIN: Warm and dry NEUROLOGIC:  Alert and oriented x 3, non-focal PSYCHIATRIC:  Normal affect, good insight  ASSESSMENT:    1. SOB (shortness of breath)   2. Depression, unspecified depression type   3. Essential hypertension    PLAN:     1.  He is hypertensive in the office today.  I have discussed with the patient that in addition to his lisinopril 20 mg a day he would benefit from additional antihypertensive.  Started patient on carvedilol 6.25 mg twice daily.  He has a history of IV drug abuse we discussed and he denies any recent use of crack  or heroin and states he has been clean and is not using this.  He understands that he should not use a beta-blocker if he used any crack cocaine.  He expresses understanding.  Given his shortness of breath I am going to get an echocardiogram to assess LV function and any other structural abnormalities.  Overall the express to the patient and he would benefit from psychiatric/counseling evaluation and will refer him today due to his depression.  I will reassess the patient after with help improve his blood pressure if any symptoms persist.  The patient is in agreement with the above plan. The patient left the office in stable condition.  The patient will follow up in 1 month due to medication change.   Medication Adjustments/Labs and Tests Ordered: Current medicines are reviewed at length with the patient today.  Concerns regarding medicines are outlined above.  Orders Placed This Encounter  Procedures  . Ambulatory referral to Psychiatry  . EKG 12-Lead  . ECHOCARDIOGRAM COMPLETE   Meds ordered this encounter  Medications  . carvedilol (COREG) 6.25 MG tablet    Sig: Take 1 tablet (6.25 mg total) by mouth 2 (two) times daily.    Dispense:   180 tablet    Refill:  3    Patient Instructions  Medication Instructions:  Start (Carvedilol) Coreg 6.25 twice a day   *If you need a refill on your cardiac medications before your next appointment, please call your pharmacy*   Lab Work: None ordered   If you have labs (blood work) drawn today and your tests are completely normal, you will receive your results only by: Marland Kitchen MyChart Message (if you have MyChart) OR . A paper copy in the mail If you have any lab test that is abnormal or we need to change your treatment, we will call you to review the results.   Testing/Procedures: Your physician has requested that you have an echocardiogram. Echocardiography is a painless test that uses sound waves to create images of your heart. It provides your doctor with information about the size and shape of your heart and how well your heart's chambers and valves are working. This procedure takes approximately one hour. There are no restrictions for this procedure.     Follow-Up: At [facility], you and your health needs are our priority.  As part of our continuing mission to provide you with exceptional heart care, we have created designated Provider Care Teams.  These Care Teams include your primary Cardiologist (physician) and Advanced Practice Providers (APPs -  Physician Assistants and Nurse Practitioners) who all work together to provide you with the care you need, when you need it.  We recommend signing up for the patient portal called "MyChart".  Sign up information is provided on this After Visit Summary.  MyChart is used to connect with patients for Virtual Visits (Telemedicine).  Patients are able to view lab/test results, encounter notes, upcoming appointments, etc.  Non-urgent messages can be sent to your provider as well.   To learn more about what you can do with MyChart, go to [email or web address].    Your next appointment:   1 month(s)  The format for your next  appointment:   In Person  Provider:   Thomasene Ripple, DO   Other Instructions You have been referred to see Psychiatry      Adopting a Healthy Lifestyle.  Know what a healthy weight is for you (roughly BMI <25) and aim to maintain this   Aim for  7+ servings of fruits and vegetables daily   65-80+ fluid ounces of water or unsweet tea for healthy kidneys   Limit to max 1 drink of alcohol per day; avoid smoking/tobacco   Limit animal fats in diet for cholesterol and heart health - choose grass fed whenever available   Avoid highly processed foods, and foods high in saturated/trans fats   Aim for low stress - take time to unwind and care for your mental health   Aim for 150 min of moderate intensity exercise weekly for heart health, and weights twice weekly for bone health   Aim for 7-9 hours of sleep daily   When it comes to diets, agreement about the perfect plan isnt easy to find, even among the experts. Experts at the [employer] developed an idea known as the Healthy Eating Plate. Just imagine a plate divided into logical, healthy portions.   The emphasis is on diet quality:   Load up on vegetables and fruits - one-half of your plate: Aim for color and variety, and remember that potatoes dont count.   Go for whole grains - one-quarter of your plate: Whole wheat, barley, wheat berries, quinoa, oats, brown rice, and foods made with them. If you want pasta, go with whole wheat pasta.   Protein power - one-quarter of your plate: Fish, chicken, beans, and nuts are all healthy, versatile protein sources. Limit red meat.   The diet, however, does go beyond the plate, offering a few other suggestions.   Use healthy plant oils, such as olive, canola, soy, corn, sunflower and peanut. Check the labels, and avoid partially hydrogenated oil, which have unhealthy trans fats.   If youre thirsty, drink water. Coffee and tea are good in moderation, but skip sugary  drinks and limit milk and dairy products to one or two daily servings.   The type of carbohydrate in the diet is more important than the amount. Some sources of carbohydrates, such as vegetables, fruits, whole grains, and beans-are healthier than others.   Finally, stay active  Signed, Thomasene RippleKardie Martina Brodbeck, DO  08/01/2020 4:21 PM    [facility]

## 2020-08-26 ENCOUNTER — Other Ambulatory Visit: Payer: Managed Care, Other (non HMO)

## 2020-09-02 ENCOUNTER — Other Ambulatory Visit: Payer: Self-pay

## 2020-09-03 ENCOUNTER — Other Ambulatory Visit: Payer: Self-pay

## 2020-09-04 ENCOUNTER — Encounter: Payer: Self-pay | Admitting: Cardiology

## 2020-09-04 ENCOUNTER — Ambulatory Visit (INDEPENDENT_AMBULATORY_CARE_PROVIDER_SITE_OTHER): Payer: Managed Care, Other (non HMO) | Admitting: Cardiology

## 2020-09-04 ENCOUNTER — Other Ambulatory Visit: Payer: Self-pay

## 2020-09-04 VITALS — BP 158/98 | HR 70 | Ht 73.0 in | Wt 218.8 lb

## 2020-09-04 DIAGNOSIS — I10 Essential (primary) hypertension: Secondary | ICD-10-CM

## 2020-09-04 MED ORDER — CARVEDILOL 12.5 MG PO TABS: 12.5000 mg | ORAL_TABLET | Freq: Two times a day (BID) | ORAL | 3 refills | Status: AC

## 2020-09-04 NOTE — Progress Notes (Signed)
Cardiology Office Note:    Date:  09/05/2020   ID:  Miki Blank, DOB May 10, 1961, MRN [id number]  PCP:  Buckner Malta, MD  Cardiologist:  Thomasene Ripple, DO  Electrophysiologist:  None   Referring MD: Buckner Malta, MD   " I am doing fine"  History of Present Illness:    John Olson is a 59 y.o. male with a hx of hypertension. I saw the patient initially on 08/01/2020 he was was hypertensive and also reported some shortness of breath. I started the patient on coreg 6.25 mg twice daily in addition to his lisinopril. For his shortness of I recommended he get an echo.   He is here today for his follow up visit. He is not really interested in talking much. He notes that he is not sure if he takes his medications everyday.     Past Medical History:  Diagnosis Date  . Depression 08/01/2020  . Essential hypertension 08/01/2020  . High blood pressure   . Pneumothorax on left-small  01/27/2020  . SDH (subdural hematoma) (HCC) 01/25/2020  . SOB (shortness of breath) 08/01/2020    Past Surgical History:  Procedure Laterality Date  . APPENDECTOMY    . COLONOSCOPY  09/03/2019  . TONSILLECTOMY      Current Medications: Current Meds  Medication Sig  . acetaminophen (TYLENOL) 500 MG tablet Take 2 tablets (1,000 mg total) by mouth every 6 (six) hours as needed (for left knee pain).  . Buprenorphine HCl-Naloxone HCl 8-2 MG FILM Place under the tongue.  . clindamycin (CLEOCIN) 300 MG capsule Take 300 mg by mouth 4 (four) times daily.  Marland Kitchen ketorolac (TORADOL) 10 MG tablet Take 10 mg by mouth 4 (four) times daily.  Marland Kitchen lisinopril (ZESTRIL) 20 MG tablet Take 20 mg by mouth daily.  . sildenafil (REVATIO) 20 MG tablet Take 20 mg by mouth See admin instructions. Take 20 mg by mouth 30 minutes prior to intercourse- do not take at the same time as Lisinopril  . sildenafil (VIAGRA) 50 MG tablet Take 50 mg by mouth at bedtime.  . [DISCONTINUED] carvedilol (COREG) 6.25 MG tablet Take 1  tablet (6.25 mg total) by mouth 2 (two) times daily.     Allergies:   Patient has no known allergies.   Social History   Socioeconomic History  . Marital status: Married    Spouse name: Not on file  . Number of children: Not on file  . Years of education: Not on file  . Highest education level: Not on file  Occupational History  . Not on file  Tobacco Use  . Smoking status: Current Every Day Smoker    Packs/day: 2.00    Types: Cigarettes  . Smokeless tobacco: Never Used  Substance and Sexual Activity  . Alcohol use: Yes    Alcohol/week: 42.0 standard drinks    Types: 42 Cans of beer per week  . Drug use: Not Currently    Types: Heroin  . Sexual activity: Not on file  Other Topics Concern  . Not on file  Social History Narrative  . Not on file   Social Determinants of Health   Financial Resource Strain:   . Difficulty of Paying Living Expenses: Not on file  Food Insecurity:   . Worried About Programme researcher, broadcasting/film/video in the Last Year: Not on file  . Ran Out of Food in the Last Year: Not on file  Transportation Needs:   . Lack of Transportation (Medical): Not on file  .  Lack of Transportation (Non-Medical): Not on file  Physical Activity:   . Days of Exercise per Week: Not on file  . Minutes of Exercise per Session: Not on file  Stress:   . Feeling of Stress : Not on file  Social Connections:   . Frequency of Communication with Friends and Family: Not on file  . Frequency of Social Gatherings with Friends and Family: Not on file  . Attends Religious Services: Not on file  . Active Member of Clubs or Organizations: Not on file  . Attends Banker Meetings: Not on file  . Marital Status: Not on file     Family History: The patient's family history includes Aneurysm in his brother; Cancer in his father; Diabetes in an other family member; Heart attack in an other family member; Hypertension in an other family member; Stroke in an other family member.  ROS:    Review of Systems  Constitution: Negative for decreased appetite, fever and weight gain.  HENT: Negative for congestion, ear discharge, hoarse voice and sore throat.   Eyes: Negative for discharge, redness, vision loss in right eye and visual halos.  Cardiovascular: Negative for chest pain, dyspnea on exertion, leg swelling, orthopnea and palpitations.  Respiratory: Negative for cough, hemoptysis, shortness of breath and snoring.   Endocrine: Negative for heat intolerance and polyphagia.  Hematologic/Lymphatic: Negative for bleeding problem. Does not bruise/bleed easily.  Skin: Negative for flushing, nail changes, rash and suspicious lesions.  Musculoskeletal: Negative for arthritis, joint pain, muscle cramps, myalgias, neck pain and stiffness.  Gastrointestinal: Negative for abdominal pain, bowel incontinence, diarrhea and excessive appetite.  Genitourinary: Negative for decreased libido, genital sores and incomplete emptying.  Neurological: Negative for brief paralysis, focal weakness, headaches and loss of balance.  Psychiatric/Behavioral: Negative for altered mental status, depression and suicidal ideas.  Allergic/Immunologic: Negative for HIV exposure and persistent infections.    EKGs/Labs/Other Studies Reviewed:    The following studies were reviewed today:   EKG:  None today.  Recent Labs: 01/25/2020: ALT 23; Magnesium 1.8 01/26/2020: BUN 8; Creatinine, Ser 0.60; Hemoglobin 15.7; Platelets 407; Potassium 4.0; Sodium 138  Recent Lipid Panel No results found for: CHOL, TRIG, HDL, CHOLHDL, VLDL, LDLCALC, LDLDIRECT  Physical Exam:    VS:  BP (!) 158/98   Pulse 70   Ht 6\' 1"  (1.854 m)   Wt 218 lb 12.8 oz (99.2 kg)   SpO2 97%   BMI 28.87 kg/m     Wt Readings from Last 3 Encounters:  09/04/20 218 lb 12.8 oz (99.2 kg)  08/01/20 209 lb 6.4 oz (95 kg)  01/25/20 222 lb 0.1 oz (100.7 kg)     GEN: Well nourished, well developed in no acute distress HEENT: Normal NECK: No  JVD; No carotid bruits LYMPHATICS: No lymphadenopathy CARDIAC: S1S2 noted,RRR, no murmurs, rubs, gallops RESPIRATORY:  Clear to auscultation without rales, wheezing or rhonchi  ABDOMEN: Soft, non-tender, non-distended, +bowel sounds, no guarding. EXTREMITIES: No edema, No cyanosis, no clubbing MUSCULOSKELETAL:  No deformity  SKIN: Warm and dry NEUROLOGIC:  Alert and oriented x 3, non-focal PSYCHIATRIC:  Normal affect, good insight  ASSESSMENT:    1. Essential hypertension    PLAN:     He is hypertensive today. I am not sure if he is complaint with his medications. However he says tha he takes it most of the time. I like to increase his coreg to 12.5 mg twice daily.   He did not get his 2D echo , when I  attempted to speak to him about the importance he prefers not to speak about it,  The patient is in agreement with the above plan. The patient left the office in stable condition.  The patient will follow up in 3 months or sooner if needed.  Medication Adjustments/Labs and Tests Ordered: Current medicines are reviewed at length with the patient today.  Concerns regarding medicines are outlined above.  No orders of the defined types were placed in this encounter.  Meds ordered this encounter  Medications  . carvedilol (COREG) 12.5 MG tablet    Sig: Take 1 tablet (12.5 mg total) by mouth 2 (two) times daily.    Dispense:  180 tablet    Refill:  3    Patient Instructions  Medication Instructions:  Your physician has recommended you make the following change in your medication:  INCREASE: Cored 12.5 mg take one tablet by mouth twice daily.  *If you need a refill on your cardiac medications before your next appointment, please call your pharmacy*   Lab Work: None If you have labs (blood work) drawn today and your tests are completely normal, you will receive your results only by: Marland Kitchen. MyChart Message (if you have MyChart) OR . A paper copy in the mail If you have any lab test  that is abnormal or we need to change your treatment, we will call you to review the results.   Testing/Procedures: Your physician has requested that you have an echocardiogram. Echocardiography is a painless test that uses sound waves to create images of your heart. It provides your doctor with information about the size and shape of your heart and how well your heart's chambers and valves are working. This procedure takes approximately one hour. There are no restrictions for this procedure.     Follow-Up: At [facility], you and your health needs are our priority.  As part of our continuing mission to provide you with exceptional heart care, we have created designated Provider Care Teams.  These Care Teams include your primary Cardiologist (physician) and Advanced Practice Providers (APPs -  Physician Assistants and Nurse Practitioners) who all work together to provide you with the care you need, when you need it.  We recommend signing up for the patient portal called "MyChart".  Sign up information is provided on this After Visit Summary.  MyChart is used to connect with patients for Virtual Visits (Telemedicine).  Patients are able to view lab/test results, encounter notes, upcoming appointments, etc.  Non-urgent messages can be sent to your provider as well.   To learn more about what you can do with MyChart, go to [email or web address].    Your next appointment:   3 month(s)  The format for your next appointment:   In Person  Provider:   Thomasene RippleKardie Kellsey Sansone, DO   Other Instructions      Adopting a Healthy Lifestyle.  Know what a healthy weight is for you (roughly BMI <25) and aim to maintain this   Aim for 7+ servings of fruits and vegetables daily   65-80+ fluid ounces of water or unsweet tea for healthy kidneys   Limit to max 1 drink of alcohol per day; avoid smoking/tobacco   Limit animal fats in diet for cholesterol and heart health - choose grass fed whenever  available   Avoid highly processed foods, and foods high in saturated/trans fats   Aim for low stress - take time to unwind and care for your mental health   Aim for 150  min of moderate intensity exercise weekly for heart health, and weights twice weekly for bone health   Aim for 7-9 hours of sleep daily   When it comes to diets, agreement about the perfect plan isnt easy to find, even among the experts. Experts at the [facility] [employer] developed an idea known as the Healthy Eating Plate. Just imagine a plate divided into logical, healthy portions.   The emphasis is on diet quality:   Load up on vegetables and fruits - one-half of your plate: Aim for color and variety, and remember that potatoes dont count.   Go for whole grains - one-quarter of your plate: Whole wheat, barley, wheat berries, quinoa, oats, brown rice, and foods made with them. If you want pasta, go with whole wheat pasta.   Protein power - one-quarter of your plate: Fish, chicken, beans, and nuts are all healthy, versatile protein sources. Limit red meat.   The diet, however, does go beyond the plate, offering a few other suggestions.   Use healthy plant oils, such as olive, canola, soy, corn, sunflower and peanut. Check the labels, and avoid partially hydrogenated oil, which have unhealthy trans fats.   If youre thirsty, drink water. Coffee and tea are good in moderation, but skip sugary drinks and limit milk and dairy products to one or two daily servings.   The type of carbohydrate in the diet is more important than the amount. Some sources of carbohydrates, such as vegetables, fruits, whole grains, and beans-are healthier than others.   Finally, stay active  Signed, Thomasene Ripple, DO  09/05/2020 8:38 PM    [facility]

## 2020-09-04 NOTE — Patient Instructions (Signed)
Medication Instructions:  Your physician has recommended you make the following change in your medication:  INCREASE: Cored 12.5 mg take one tablet by mouth twice daily.  *If you need a refill on your cardiac medications before your next appointment, please call your pharmacy*   Lab Work: None If you have labs (blood work) drawn today and your tests are completely normal, you will receive your results only by: Marland Kitchen MyChart Message (if you have MyChart) OR . A paper copy in the mail If you have any lab test that is abnormal or we need to change your treatment, we will call you to review the results.   Testing/Procedures: Your physician has requested that you have an echocardiogram. Echocardiography is a painless test that uses sound waves to create images of your heart. It provides your doctor with information about the size and shape of your heart and how well your heart's chambers and valves are working. This procedure takes approximately one hour. There are no restrictions for this procedure.     Follow-Up: At [facility], you and your health needs are our priority.  As part of our continuing mission to provide you with exceptional heart care, we have created designated Provider Care Teams.  These Care Teams include your primary Cardiologist (physician) and Advanced Practice Providers (APPs -  Physician Assistants and Nurse Practitioners) who all work together to provide you with the care you need, when you need it.  We recommend signing up for the patient portal called "MyChart".  Sign up information is provided on this After Visit Summary.  MyChart is used to connect with patients for Virtual Visits (Telemedicine).  Patients are able to view lab/test results, encounter notes, upcoming appointments, etc.  Non-urgent messages can be sent to your provider as well.   To learn more about what you can do with MyChart, go to [email or web address].    Your next appointment:   3  month(s)  The format for your next appointment:   In Person  Provider:   Thomasene Ripple, DO   Other Instructions

## 2020-12-05 ENCOUNTER — Ambulatory Visit: Payer: Managed Care, Other (non HMO) | Admitting: Cardiology
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
## 2021-08-27 IMAGING — DX DG CHEST 1V PORT
1 series · 1 of 1 positions shown · non-contrast
Comparison: January 27, 2020.

CLINICAL DATA: Left-sided pneumothorax

EXAM:
PORTABLE CHEST 1 VIEW

[chest ap]
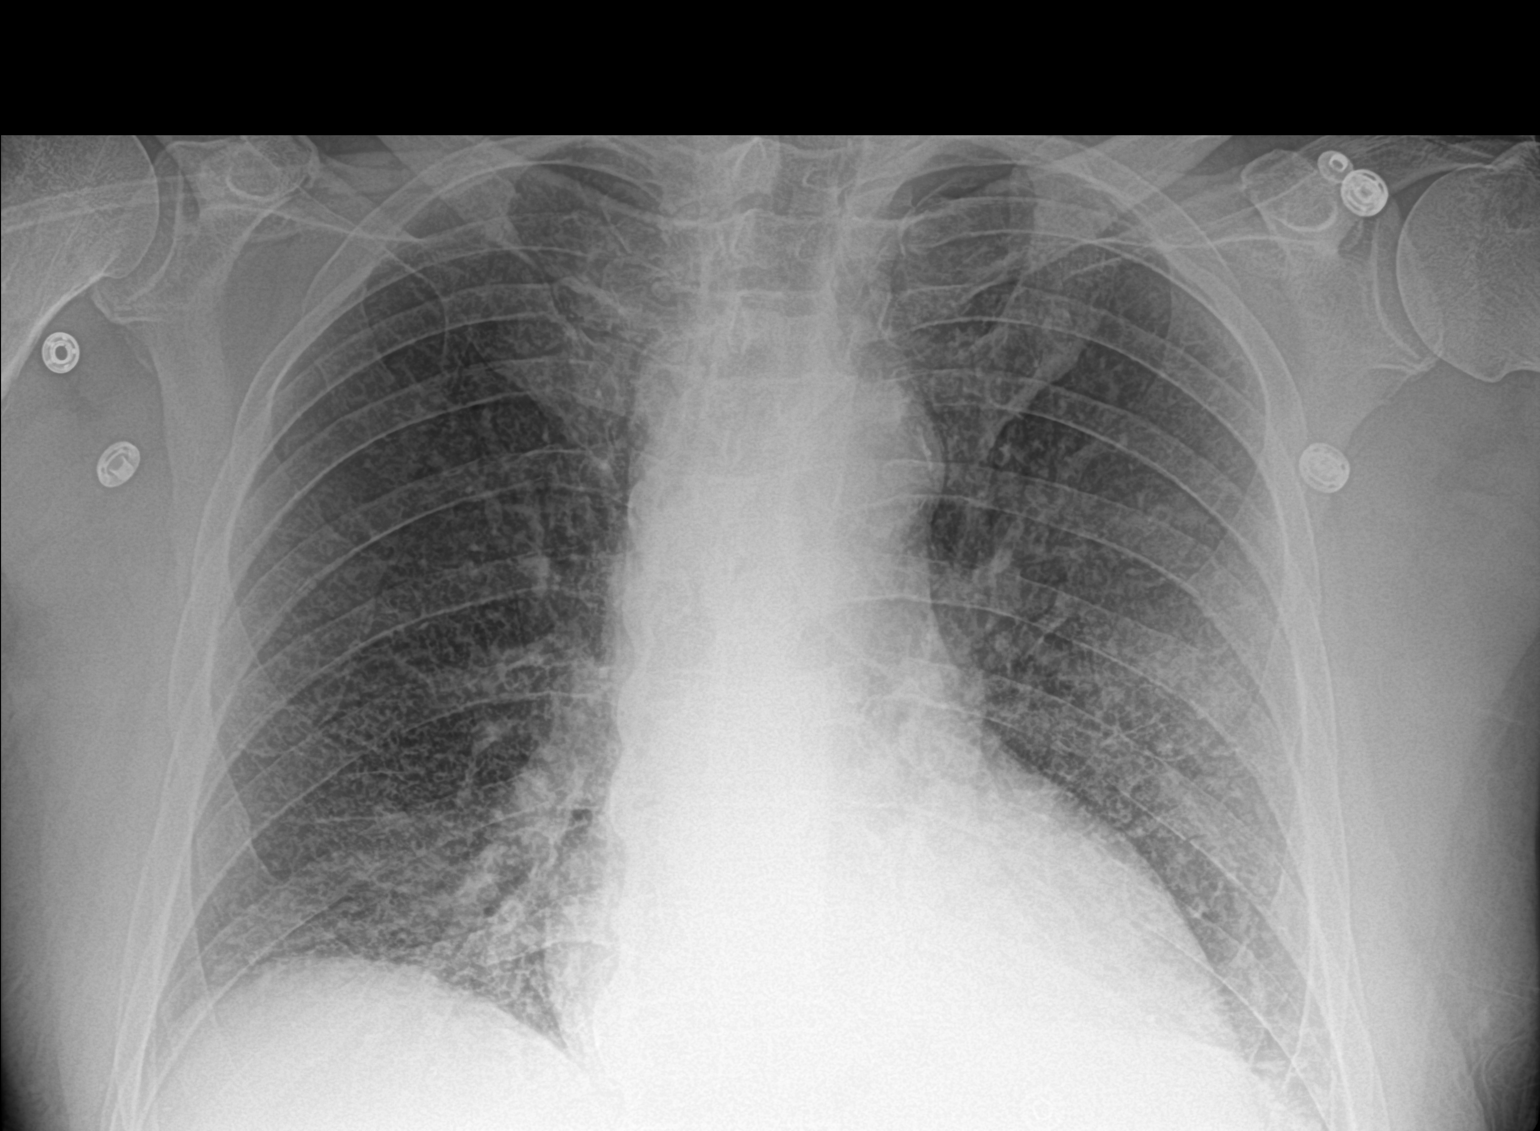

[1 of 1 positions shown; findings below may reference images not displayed]

FINDINGS: There is a persistent small left apical pneumothorax, slightly
smaller than 1 day prior. No tension component. There is mild
atelectatic change in the left base. No edema or consolidation.
Heart is upper normal in size with pulmonary vascularity normal. No
adenopathy. There is aortic atherosclerosis. No bone lesions.
IMPRESSION: Left apical pneumothorax slightly smaller than 1 day prior. Mild
left base atelectasis. No edema or consolidation. Stable cardiac
silhouette. Aortic Atherosclerosis ([id number]).

## 2022-12-09 DEATH — deceased
